# Patient Record
Sex: Female | Born: 1980 | Race: Black or African American | Hispanic: No | Marital: Single | Smoking: Never smoker
Health system: Southern US, Community
[De-identification: ages and names within clinical notes are randomized; demographics above are authoritative.]

## PROBLEM LIST (undated history)

## (undated) HISTORY — PX: KNEE ARTHROSCOPY: SHX127

## (undated) HISTORY — PX: APPENDECTOMY: SHX54

---

## 2009-06-09 ENCOUNTER — Inpatient Hospital Stay: Payer: Self-pay

## 2012-11-18 ENCOUNTER — Emergency Department: Payer: Self-pay | Admitting: Emergency Medicine

## 2012-11-18 LAB — BASIC METABOLIC PANEL
Calcium, Total: 9.2 mg/dL (ref 8.5–10.1)
Co2: 29 mmol/L (ref 21–32)
Creatinine: 0.92 mg/dL (ref 0.60–1.30)
EGFR (African American): >60

## 2012-11-18 LAB — CBC
HCT: 37.8 % (ref 35.0–47.0)
HGB: 12.4 g/dL (ref 12.0–16.0)
MCH: 28.8 pg (ref 26.0–34.0)
MCV: 88 fL (ref 80–100)
Platelet: 347 10*3/uL (ref 150–440)
WBC: 9.3 10*3/uL (ref 3.6–11.0)

## 2012-11-18 LAB — URINALYSIS, COMPLETE
Bacteria: NONE SEEN
Bilirubin,UR: NEGATIVE
Glucose,UR: NEGATIVE mg/dL (ref 0–75)
Nitrite: NEGATIVE
Specific Gravity: 1.024 (ref 1.003–1.030)

## 2014-02-26 IMAGING — CR DG ABDOMEN 3V
1 series · 5 of 5 positions shown · non-contrast
Comparison: none

REASON FOR EXAM: Constipation, RUQ Pain
COMMENTS:

[Series 1: w chest pa · 0.14mm/px · 5 of 5 slices shown]
[im 1/5]
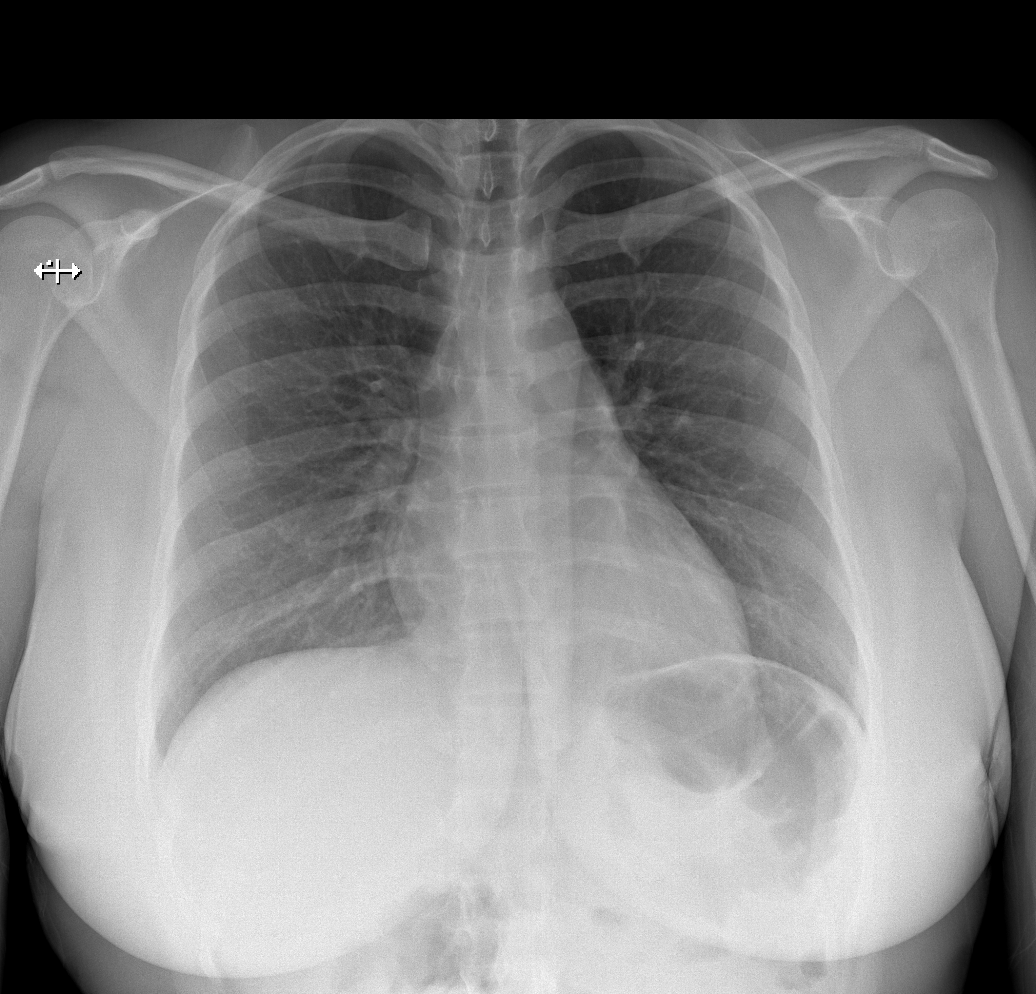
[im 2/5]
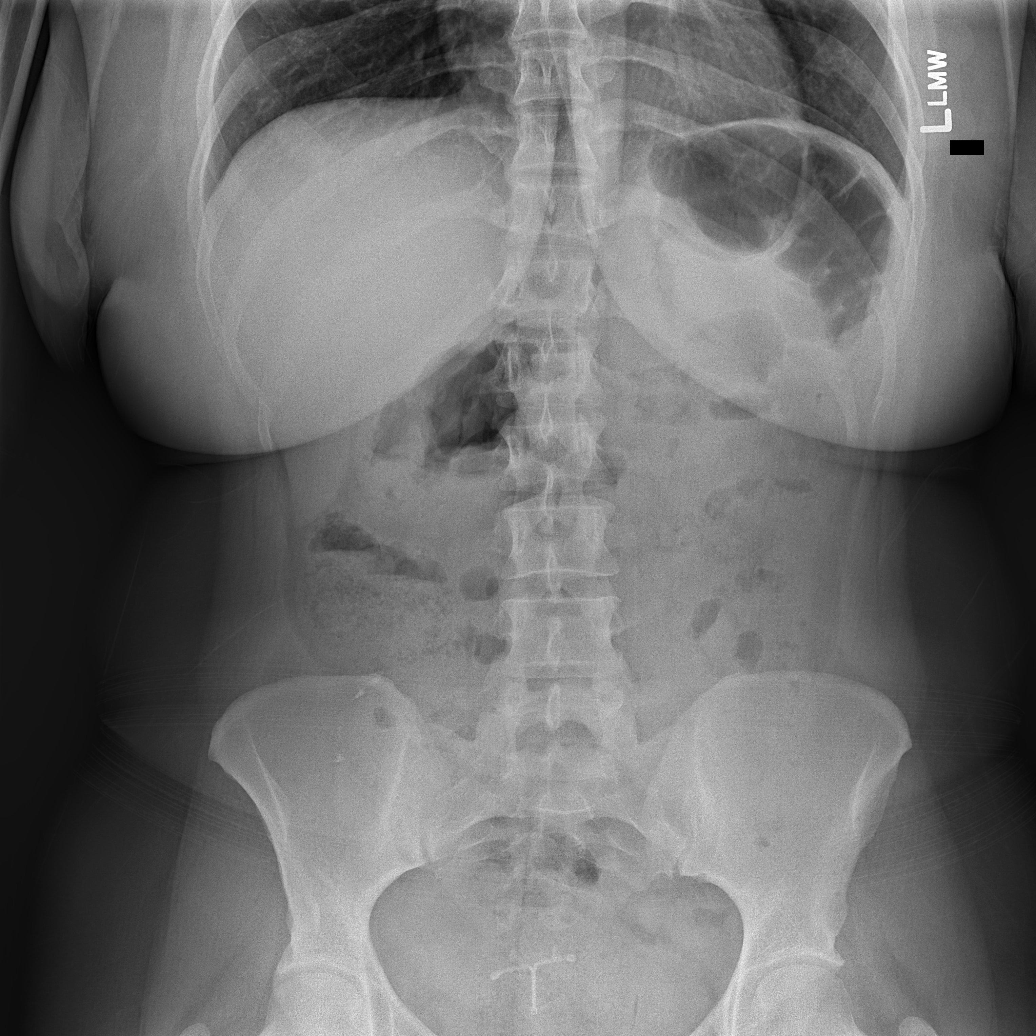
[im 3/5]
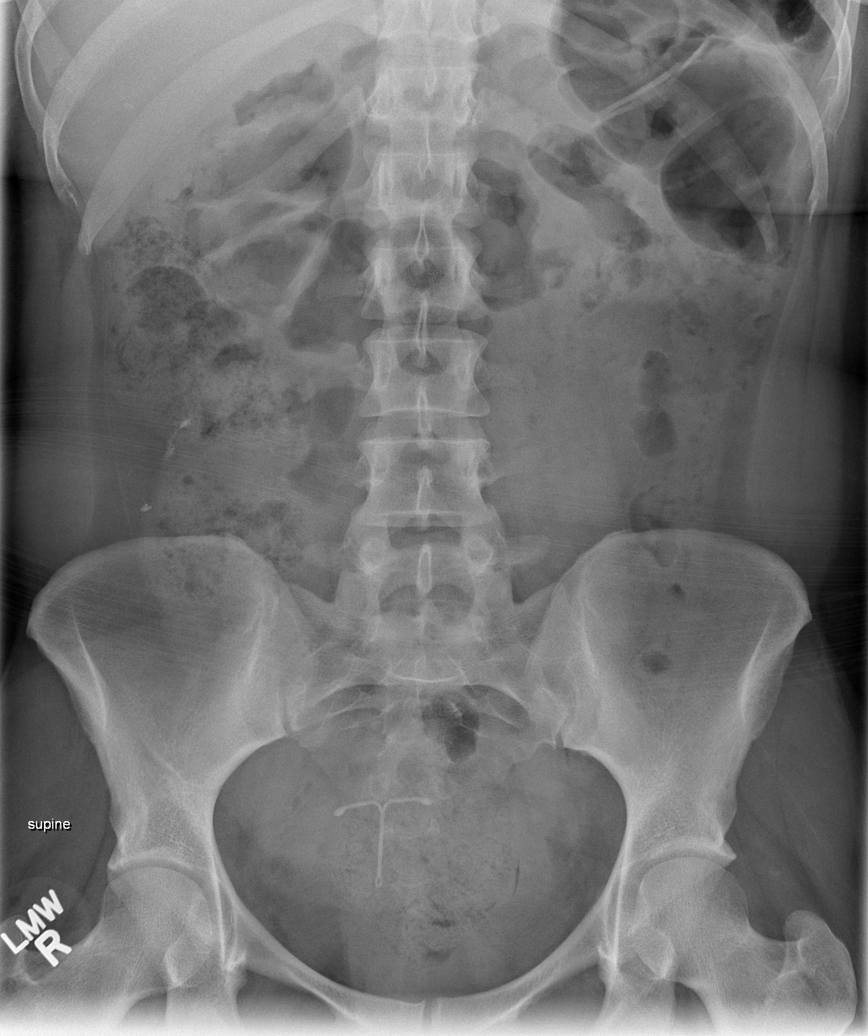
[im 4/5]
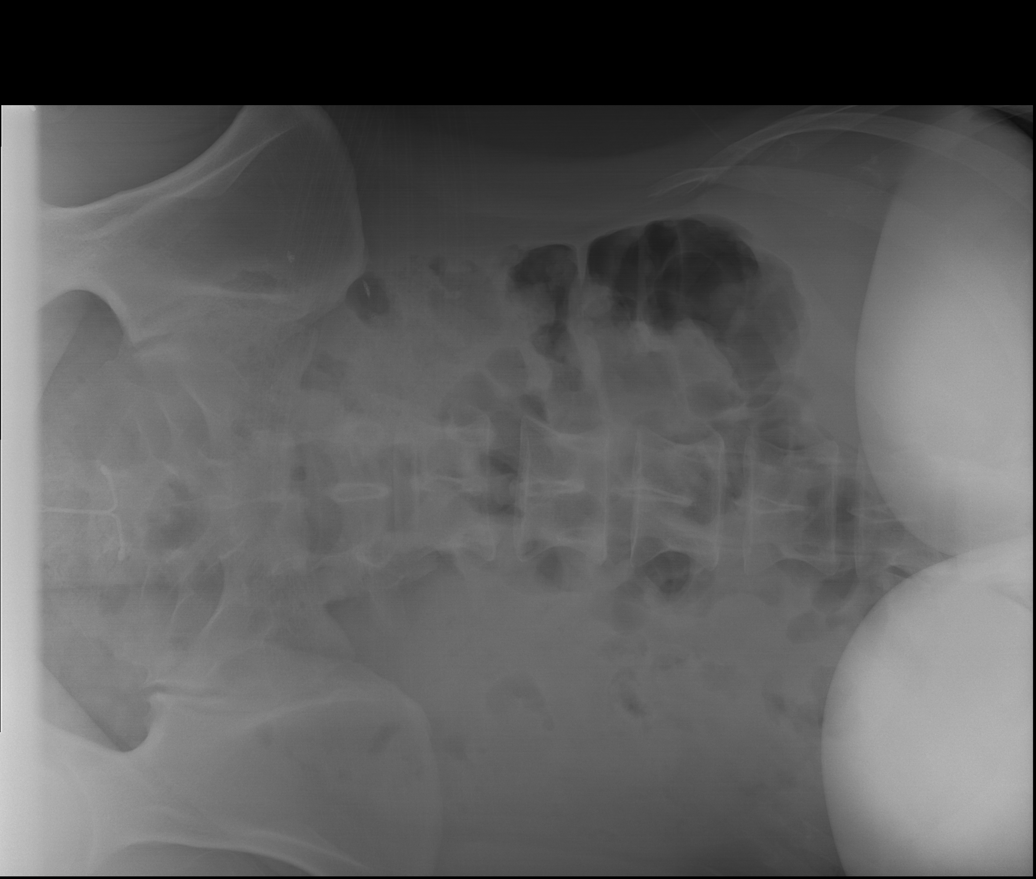
[im 5/5]
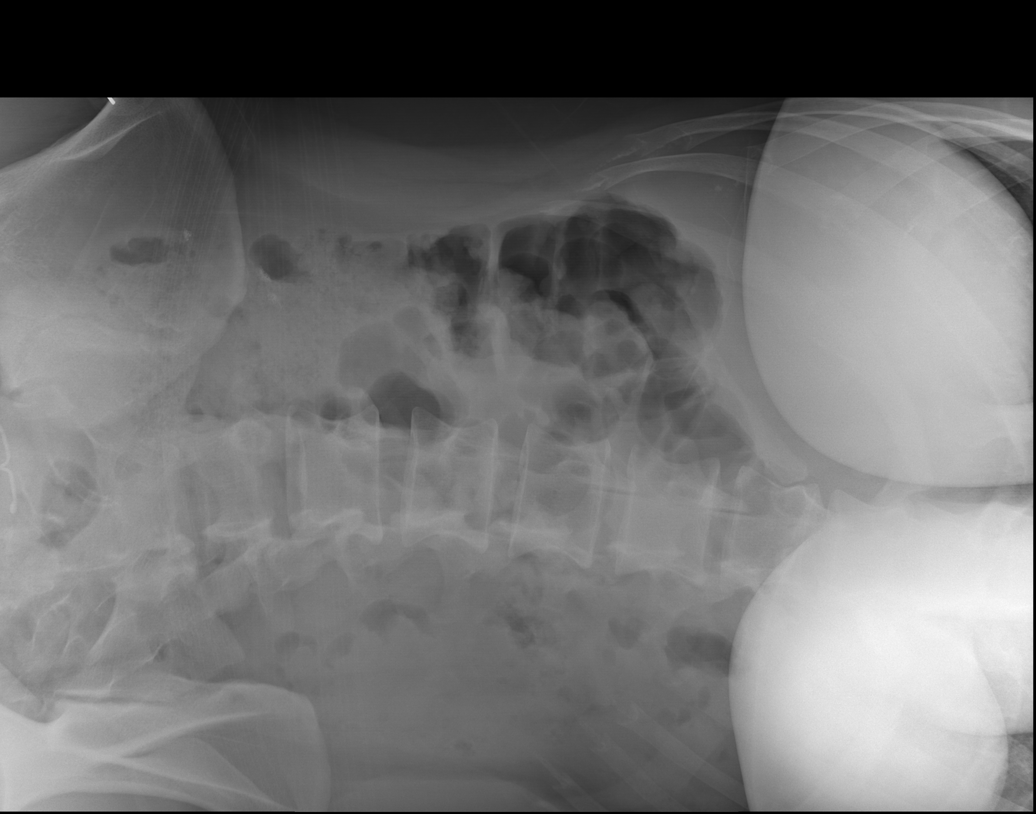

[5 of 5 positions shown; findings below may reference images not displayed]

PROCEDURE:     DXR - DXR ABDOMEN 3-WAY (INCL PA CXR)  - November 18, 2012  [DATE]

RESULT:     The lungs are clear. The heart and pulmonary vessels are normal.
The bowel gas pattern demonstrates air and fecal material scattered through
the colon to the rectum. There is an intrauterine device present. Bony
structures appear unremarkable. Surgical clips appear to be present in the
lateral right mid abdomen just above the right iliac crest. There is no free
air or abnormal bowel distention. No nephrolithiasis is evident.
IMPRESSION: No acute cardiopulmonary disease. No evidence of bowel
obstruction or perforation. There is a moderate amount of retained fecal
material in the colon. Correlate for constipation.

[REDACTED]

## 2014-02-26 IMAGING — CT CT ABD-PELV W/ CM
1 of 2 series · 15 of 32 positions shown, 19 images · non-contrast
Comparison: none

REASON FOR EXAM: RLQ pain with constipation. wbc 9.3. hx: appendectomy
COMMENTS:

[Series 2: 3mm soft tissue · axial · 0.72mm/px · z∈[-1024,-589]mm · 15 of 159 slices shown, 19 images]
[im 7/159  soft-tissue]
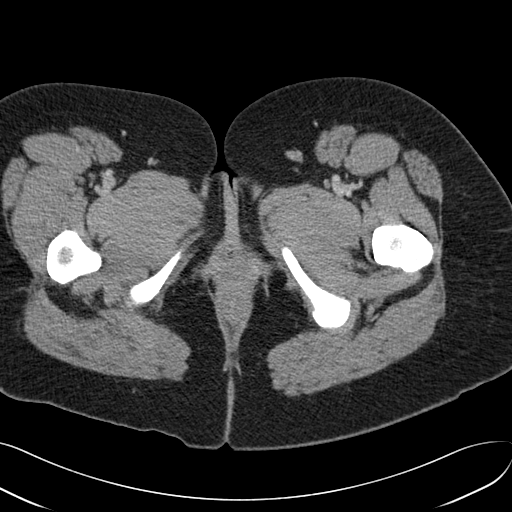
[im 7/159  bone]
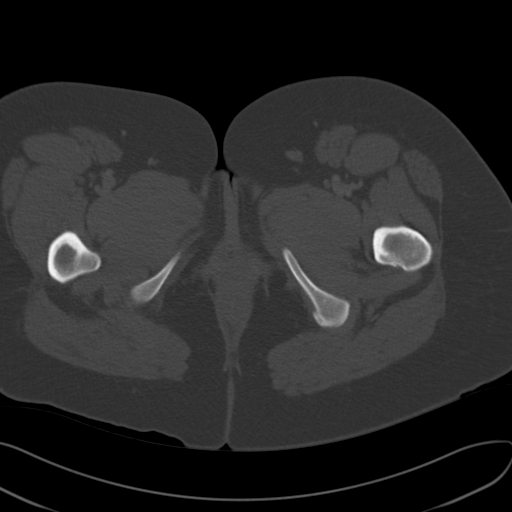
[im 19/159  soft-tissue]
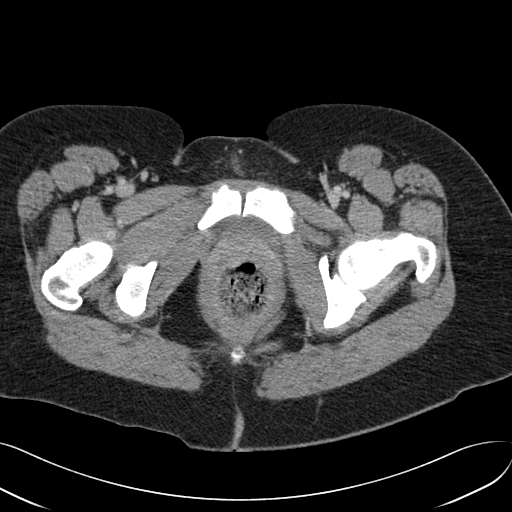
[im 32/159  soft-tissue]
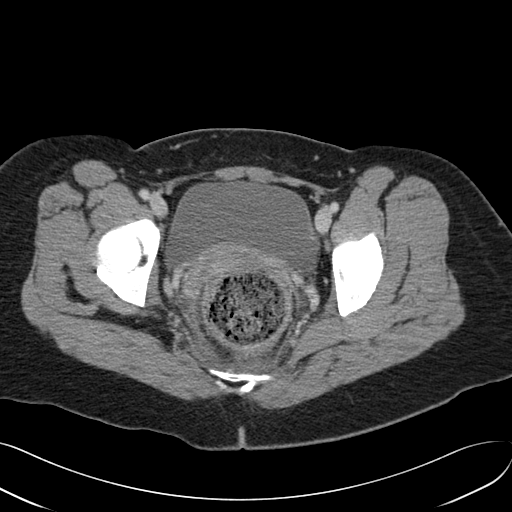
[im 45/159  soft-tissue]
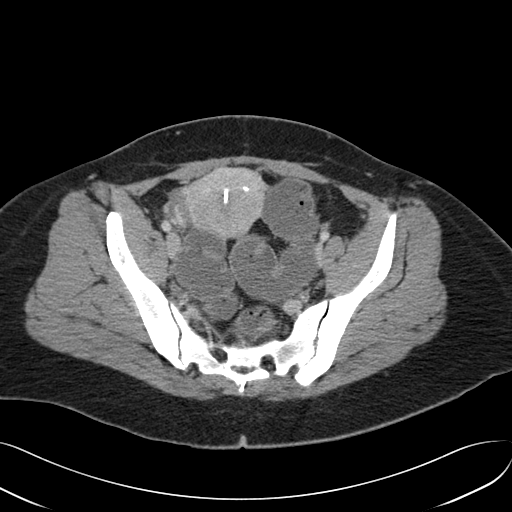
[im 57/159  soft-tissue]
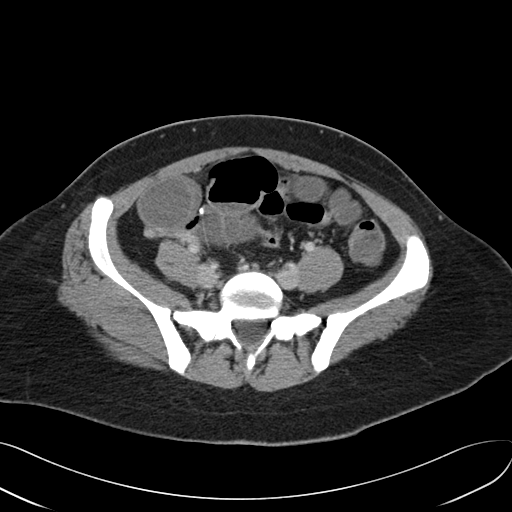
[im 70/159  soft-tissue]
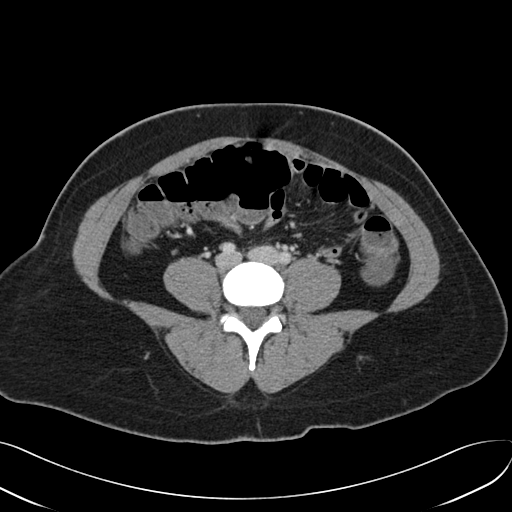
[im 83/159  soft-tissue]
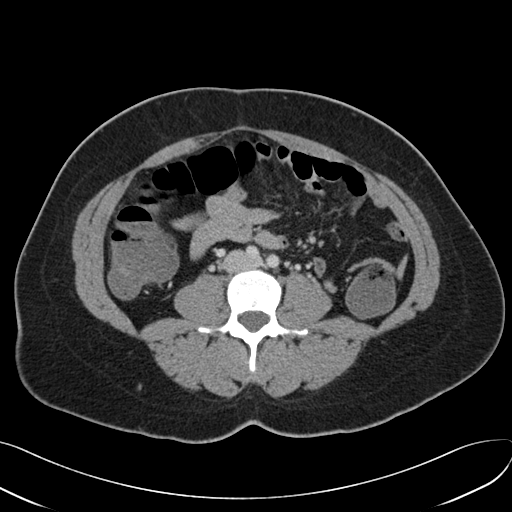
[im 89/159  soft-tissue]
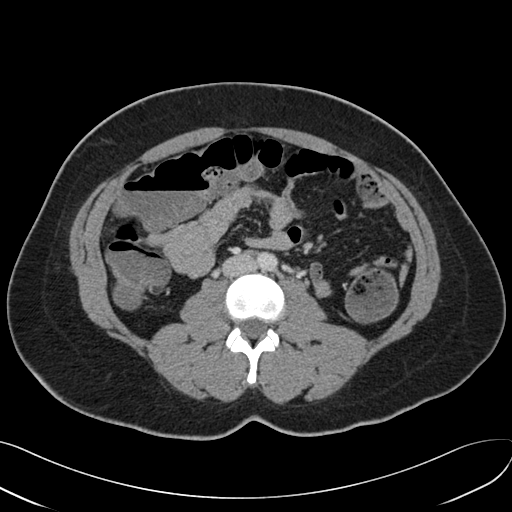
[im 102/159  soft-tissue]
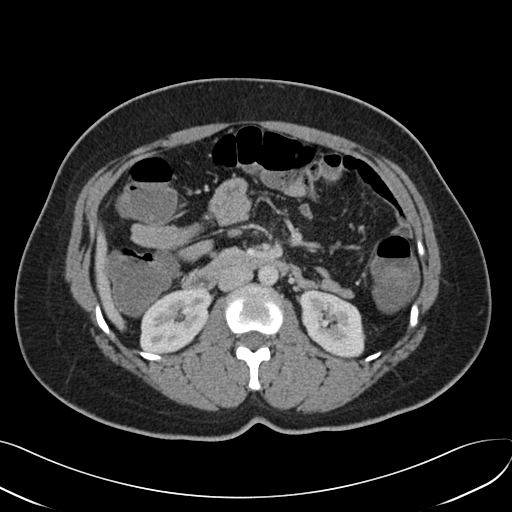
[im 102/159  bone]
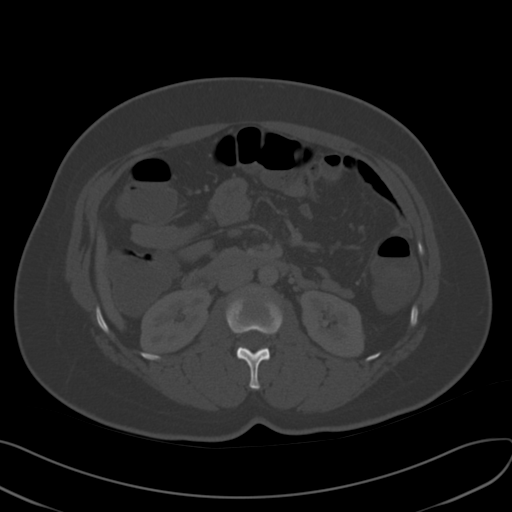
[im 114/159  soft-tissue]
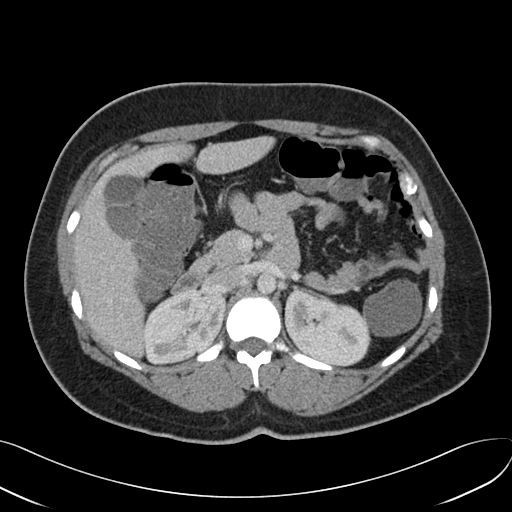
[im 127/159  soft-tissue]
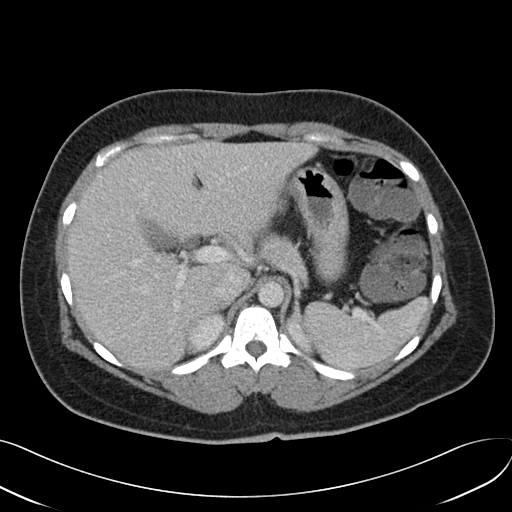
[im 133/159  lung]
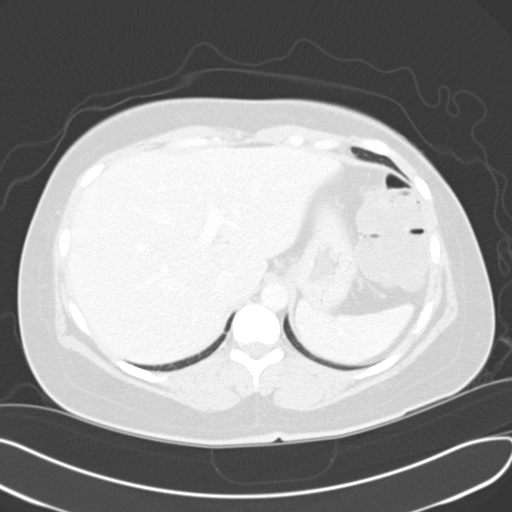
[im 140/159  soft-tissue]
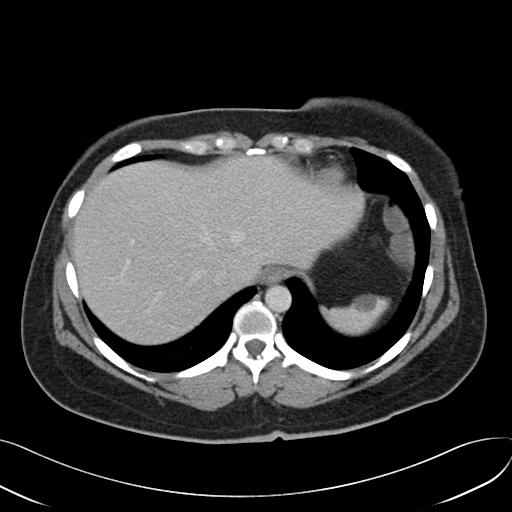
[im 140/159  lung]
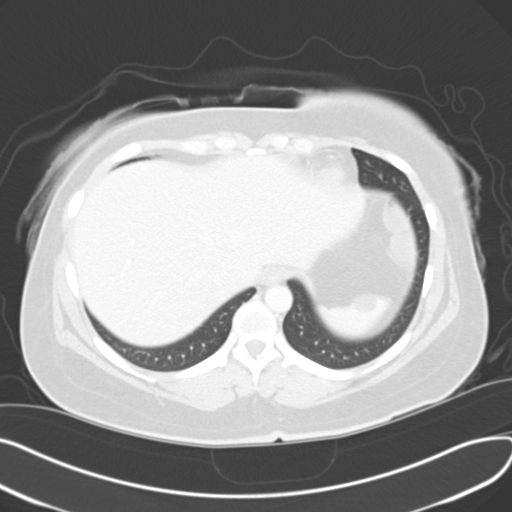
[im 146/159  lung]
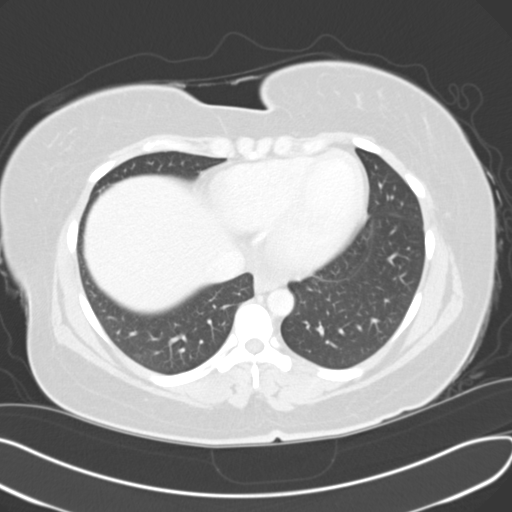
[im 152/159  soft-tissue]
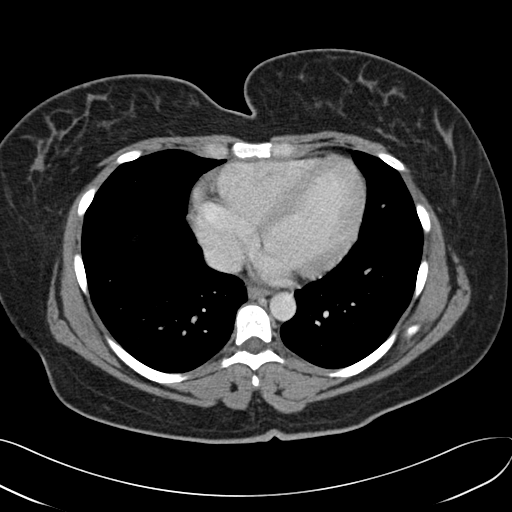
[im 152/159  lung]
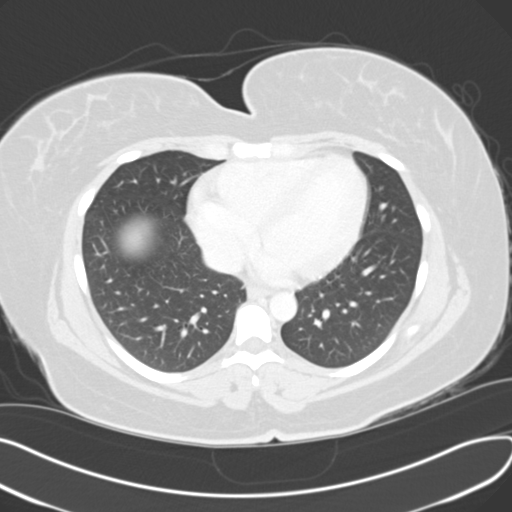

[15 of 32 positions shown; findings below may reference images not displayed]

PROCEDURE:     CT  - CT ABDOMEN / PELVIS  W  - November 18, 2012  [DATE]

RESULT:     Axial CT scanning was performed through the abdomen and pelvis
following administration of 100 cc of Bsovue-DFF. The patient did not
receive oral contrast material.

The liver exhibits no focal mass or ductal dilation. The gallbladder is
adequately distended with no evidence of stones or wall thickening or
pericholecystic fluid. The pancreas, spleen, and stomach exhibit no acute
abnormality. There is a 1.9 cm cystic structure associated with the superior
aspect of the spleen.

The kidneys enhance well with no evidence of stones or obstruction area
there is an IUD in place. There are no adnexal masses.

The colon is largely fluid-filled throughout its course. The appendix is
surgically absent. There is a large amount of stool in the rectal vault.
There is no evidence of a small bowel obstruction or ileus. The lung bases
are clear. The lumbar vertebral bodies are preserved in height.
IMPRESSION: 1. The rectum is distended with stool. Proximal to this within the colon
there is considerable stool and gas without evidence of wall thickening oral
massive distention. This may reflect relative colonic obstruction behind a
fecal impaction.
2. There is no small bowel abnormality area
3. There is no evidence of acute hepatobiliary abnormality nor acute urinary
tract abnormality.
4. There is a 1.9 cm diameter splenic cyst.

A preliminary report was sent to the [HOSPITAL] the conclusion
of the study.

[REDACTED]

## 2014-03-29 ENCOUNTER — Emergency Department: Payer: Self-pay | Admitting: Emergency Medicine

## 2015-07-07 IMAGING — CR DG KNEE COMPLETE 4+V*L*
1 series · 4 of 4 positions shown · non-contrast
Comparison: None.

CLINICAL DATA: Status post fall 1 week ago now with persistent left
knee pain

EXAM:
LEFT KNEE - COMPLETE 4+ VIEW

[Series 1: ap · 0.17mm/px · 4 of 4 slices shown]
[im 1/4]
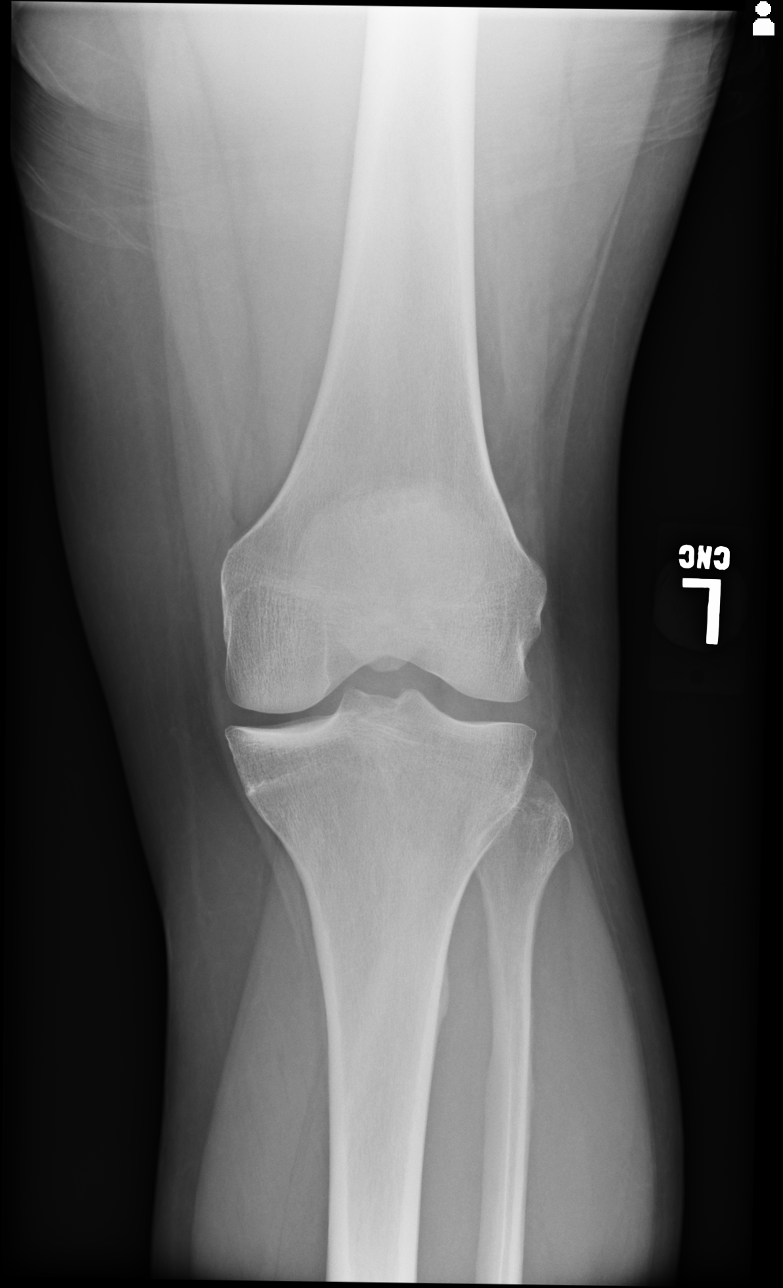
[im 2/4]
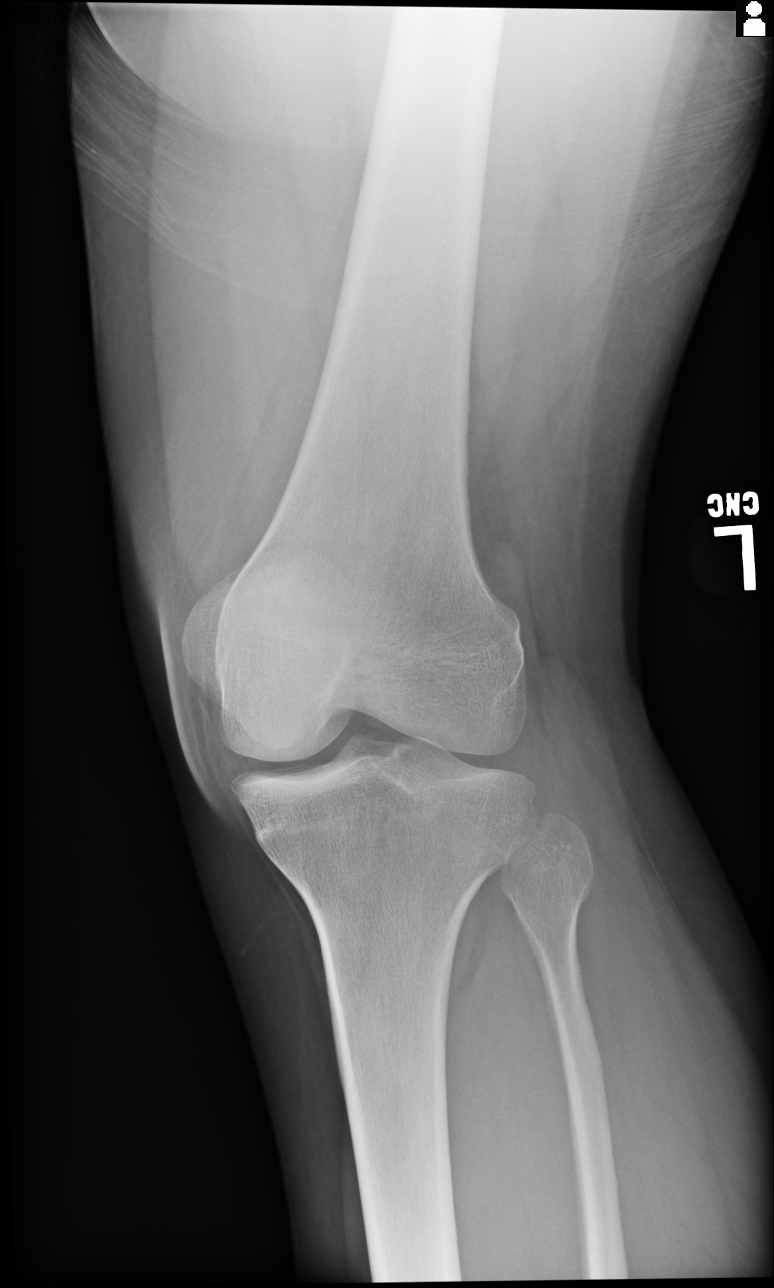
[im 3/4]
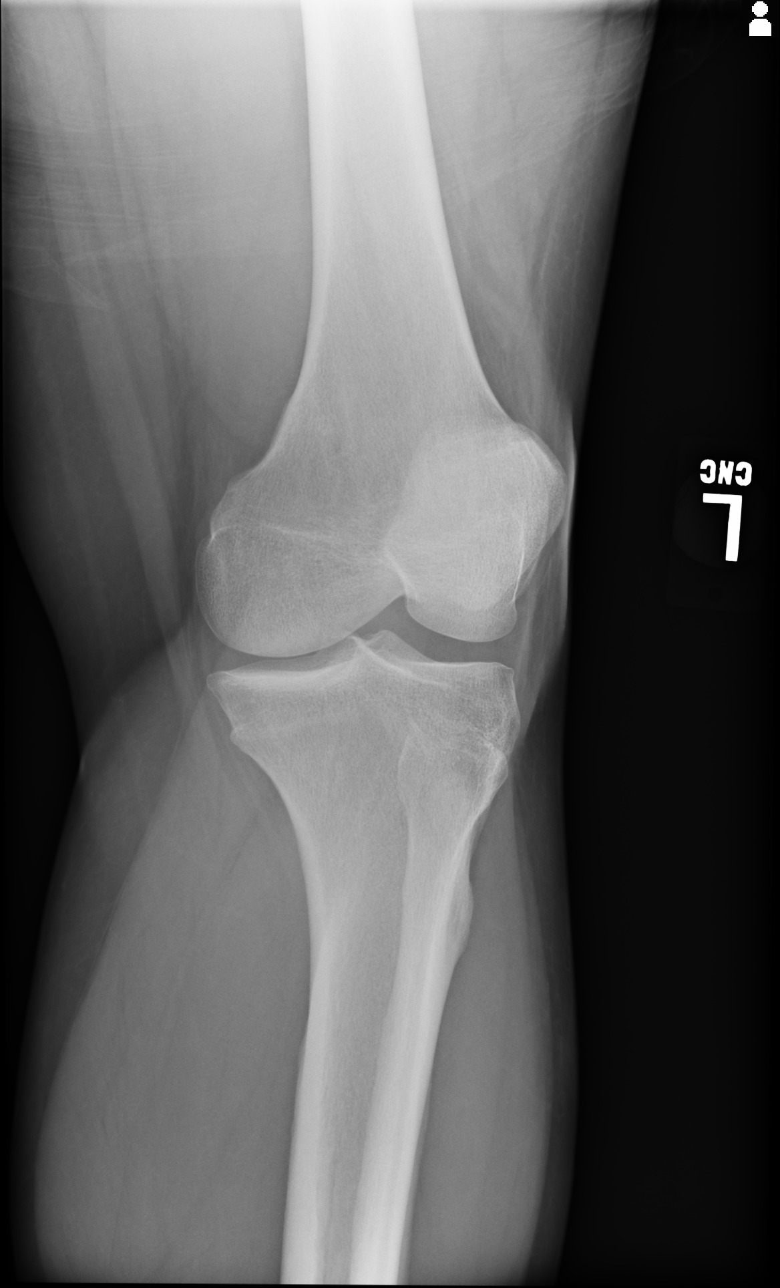
[im 4/4]
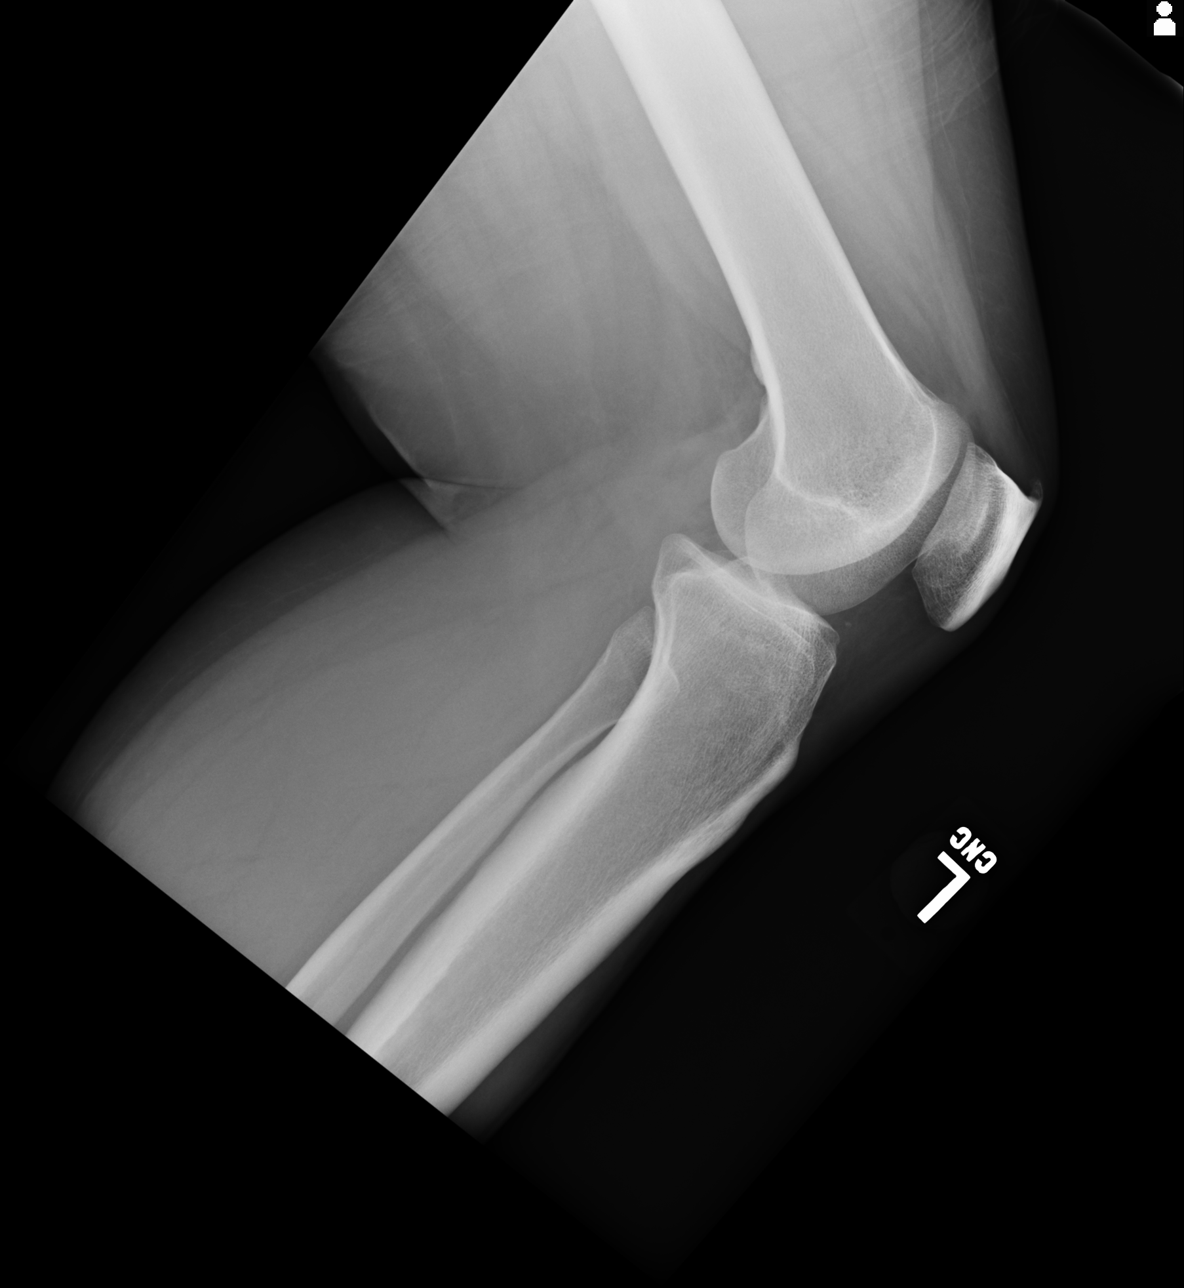

[4 of 4 positions shown; findings below may reference images not displayed]

FINDINGS: The bones are adequately mineralized. There is no acute fracture nor
dislocation. There is no significant degenerative change. There is
no joint effusion.
IMPRESSION: There is no acute bony abnormality of the left knee.

## 2018-11-04 ENCOUNTER — Other Ambulatory Visit: Payer: Self-pay

## 2018-11-04 ENCOUNTER — Encounter: Payer: Self-pay | Admitting: Emergency Medicine

## 2018-11-04 ENCOUNTER — Emergency Department
Admission: EM | Admit: 2018-11-04 | Discharge: 2018-11-04 | Disposition: A | Discharge status: Home / Self Care | Payer: Self-pay | Attending: Emergency Medicine | Admitting: Emergency Medicine

## 2018-11-04 DIAGNOSIS — R609 Edema, unspecified: Secondary | ICD-10-CM

## 2018-11-04 DIAGNOSIS — R600 Localized edema: Secondary | ICD-10-CM | POA: Insufficient documentation

## 2018-11-04 MED ORDER — FUROSEMIDE 20 MG PO TABS: 20.0000 mg | ORAL_TABLET | Freq: Every day | ORAL | 0 refills | Status: AC

## 2018-11-04 NOTE — ED Provider Notes (Signed)
[facility] Emergency Department Provider Note   ____________________________________________   First MD Initiated Contact with Patient 11/04/18 [phone]     (approximate)  I have reviewed the triage vital signs and the nursing notes.   HISTORY  Chief Complaint Foot Swelling    HPI Emily George is a 38 y.o. female patient presents with bilateral foot swelling right greater than left for 1 week.  Patient states there is mild pain to the dorsal aspect of her foot.  Patient denies change in diet but states she has been standing more than normal.  Patient denies shortness of breath or chest pain.  Patient denies smoke.  Patient is not on birth control.  Patient rates pain as a 6/10.  Patient described the pain is "achy".  No palliative measure for complaint.      History reviewed. No pertinent past medical history.  There are no active problems to display for this patient.   Past Surgical History:  Procedure Laterality Date  . APPENDECTOMY    . CESAREAN SECTION    . KNEE ARTHROSCOPY      Prior to Admission medications   Medication Sig Start Date End Date Taking? Authorizing Provider  furosemide (LASIX) 20 MG tablet Take 1 tablet (20 mg total) by mouth daily. 11/04/18 11/04/19  Joni Reining, PA-C    Allergies Patient has no known allergies.  No family history on file.  Social History Social History   Tobacco Use  . Smoking status: Never Smoker  . Smokeless tobacco: Never Used  Substance Use Topics  . Alcohol use: Never    Frequency: Never  . Drug use: Not on file    Review of Systems Constitutional: No fever/chills Eyes: No visual changes. ENT: No sore throat. Cardiovascular: Denies chest pain. Respiratory: Denies shortness of breath. Gastrointestinal: No abdominal pain.  No nausea, no vomiting.  No diarrhea.  No constipation. Genitourinary: Negative for dysuria. Musculoskeletal: Negative for back pain. Skin: Negative for rash.   Bilateral foot edema. Neurological: Negative for headaches, focal weakness or numbness.   ____________________________________________   PHYSICAL EXAM:  VITAL SIGNS: ED Triage Vitals  Enc Vitals Group     BP 11/04/18 0943 137/75     Pulse Rate 11/04/18 0943 71     Resp 11/04/18 0943 16     Temp 11/04/18 0943 98.3 F (36.8 C)     Temp Source 11/04/18 0943 Oral     SpO2 11/04/18 0943 100 %     Weight 11/04/18 0944 180 lb (81.6 kg)     Height 11/04/18 0944 5\' 6"  (1.676 m)     Head Circumference --      Peak Flow --      Pain Score 11/04/18 0943 6     Pain Loc --      Pain Edu? --      Excl. in GC? --    Constitutional: Alert and oriented. Well appearing and in no acute distress. Neck: No stridor.  Cardiovascular: Normal rate, regular rhythm. Grossly normal heart sounds.  Good peripheral circulation. Respiratory: Normal respiratory effort.  No retractions. Lungs CTAB. Musculoskeletal: No lower extremity tenderness nor edema.  No joint effusions. Neurologic:  Normal speech and language. No gross focal neurologic deficits are appreciated. No gait instability. Skin:  Skin is warm, dry and intact. No rash noted.  Dorsal aspect of the right foot measures 19 cm versus 18 cm the left also foot. Psychiatric: Mood and affect are normal. Speech and behavior are  normal.  ____________________________________________   LABS (all labs ordered are listed, but only abnormal results are displayed)  Labs Reviewed - No data to display ____________________________________________  EKG   ____________________________________________  RADIOLOGY  ED MD interpretation:    Official radiology report(s): No results found.  ____________________________________________   PROCEDURES  Procedure(s) performed (including Critical Care):  Procedures   ____________________________________________   INITIAL IMPRESSION / ASSESSMENT AND PLAN / ED COURSE  As part of my medical decision  making, I reviewed the following data within the electronic MEDICAL RECORD NUMBER         Patient presents with bilateral mild foot swelling consistent with peripheral edema.  Patient given discharge care instruction advised take medication as directed.  Patient vies follow-up with the [facility] condition persist.  Return to ED if shortness of breath or increased leg pain develop.      ____________________________________________   FINAL CLINICAL IMPRESSION(S) / ED DIAGNOSES  Final diagnoses:  Mild peripheral edema     ED Discharge Orders         Ordered    furosemide (LASIX) 20 MG tablet  Daily     11/04/18 1001           Note:  This document was prepared using Dragon voice recognition software and may include unintentional dictation errors.    Joni Reining, PA-C 11/04/18 1008    Jene Every, MD 11/07/18 [phone]

## 2018-11-04 NOTE — ED Triage Notes (Signed)
Patient presents to the ED with bilateral foot swelling x 1 week.  Patient states some pain to the tops of her feet.  Swelling does not appear significant to this RN.  Patient denies any new diet changes or being on her feet more or less than normal.  Denies history of CHF.  Patient denies shortness of breath or weakness.

## 2018-11-04 NOTE — ED Notes (Signed)
See triage note  Presents with swelling to both feet and lower ext  States she noticed this swelling about 1 week ago  Min swelling noted with good pulses  Ambulates well  Denies any SOB,c/p or injury

## 2024-01-07 ENCOUNTER — Other Ambulatory Visit

## 2024-01-11 ENCOUNTER — Other Ambulatory Visit

## 2024-01-13 ENCOUNTER — Other Ambulatory Visit

## 2024-01-17 ENCOUNTER — Ambulatory Visit: Payer: Self-pay | Admitting: Oncology

## 2024-01-17 DIAGNOSIS — Z111 Encounter for screening for respiratory tuberculosis: Secondary | ICD-10-CM

## 2024-01-19 LAB — QUANTIFERON-TB GOLD PLUS
QuantiFERON Mitogen Value: >10 [IU]/mL
QuantiFERON Nil Value: 0.03 [IU]/mL
QuantiFERON TB1 Ag Value: 0.07 [IU]/mL
QuantiFERON TB2 Ag Value: 0.06 [IU]/mL
QuantiFERON-TB Gold Plus: NEGATIVE
